# Patient Record
Sex: Male | Born: 1955 | Race: Black or African American | Hispanic: No | Smoking: Never smoker
Health system: Southern US, Community
[De-identification: ages and names within clinical notes are randomized; demographics above are authoritative.]

## PROBLEM LIST (undated history)

## (undated) DIAGNOSIS — I10 Essential (primary) hypertension: Secondary | ICD-10-CM

## (undated) HISTORY — PX: ACHILLES TENDON REPAIR: SUR1153

## (undated) HISTORY — PX: HIP SURGERY: SHX245

## (undated) HISTORY — PX: KNEE SURGERY: SHX244

---

## 2002-11-09 ENCOUNTER — Ambulatory Visit (HOSPITAL_COMMUNITY): Admission: RE | Admit: 2002-11-09 | Discharge: 2002-11-09 | Payer: Self-pay | Admitting: Orthopaedic Surgery

## 2002-11-09 ENCOUNTER — Ambulatory Visit (HOSPITAL_BASED_OUTPATIENT_CLINIC_OR_DEPARTMENT_OTHER): Admission: RE | Admit: 2002-11-09 | Discharge: 2002-11-09 | Payer: Self-pay | Admitting: Orthopaedic Surgery

## 2003-01-18 ENCOUNTER — Inpatient Hospital Stay (HOSPITAL_COMMUNITY): Admission: RE | Admit: 2003-01-18 | Discharge: 2003-01-21 | Payer: Self-pay | Admitting: Orthopaedic Surgery

## 2004-08-28 ENCOUNTER — Inpatient Hospital Stay (HOSPITAL_COMMUNITY): Admission: RE | Admit: 2004-08-28 | Discharge: 2004-08-31 | Payer: Self-pay | Admitting: Orthopaedic Surgery

## 2005-02-21 ENCOUNTER — Inpatient Hospital Stay (HOSPITAL_COMMUNITY): Admission: RE | Admit: 2005-02-21 | Discharge: 2005-02-25 | Payer: Self-pay | Admitting: Orthopaedic Surgery

## 2006-01-09 ENCOUNTER — Inpatient Hospital Stay (HOSPITAL_COMMUNITY): Admission: RE | Admit: 2006-01-09 | Discharge: 2006-01-14 | Payer: Self-pay | Admitting: Orthopaedic Surgery

## 2006-12-11 ENCOUNTER — Inpatient Hospital Stay (HOSPITAL_COMMUNITY): Admission: RE | Admit: 2006-12-11 | Discharge: 2006-12-15 | Payer: Self-pay | Admitting: Orthopaedic Surgery

## 2008-05-14 IMAGING — CR DG CHEST 1V PORT
1 series · 1 of 1 positions shown · non-contrast
Comparison: none

CLINICAL DATA: Fever.  Degenerative joint disease, right hip. 
PORTABLE CHEST ? 1 VIEW:

[view not recorded]
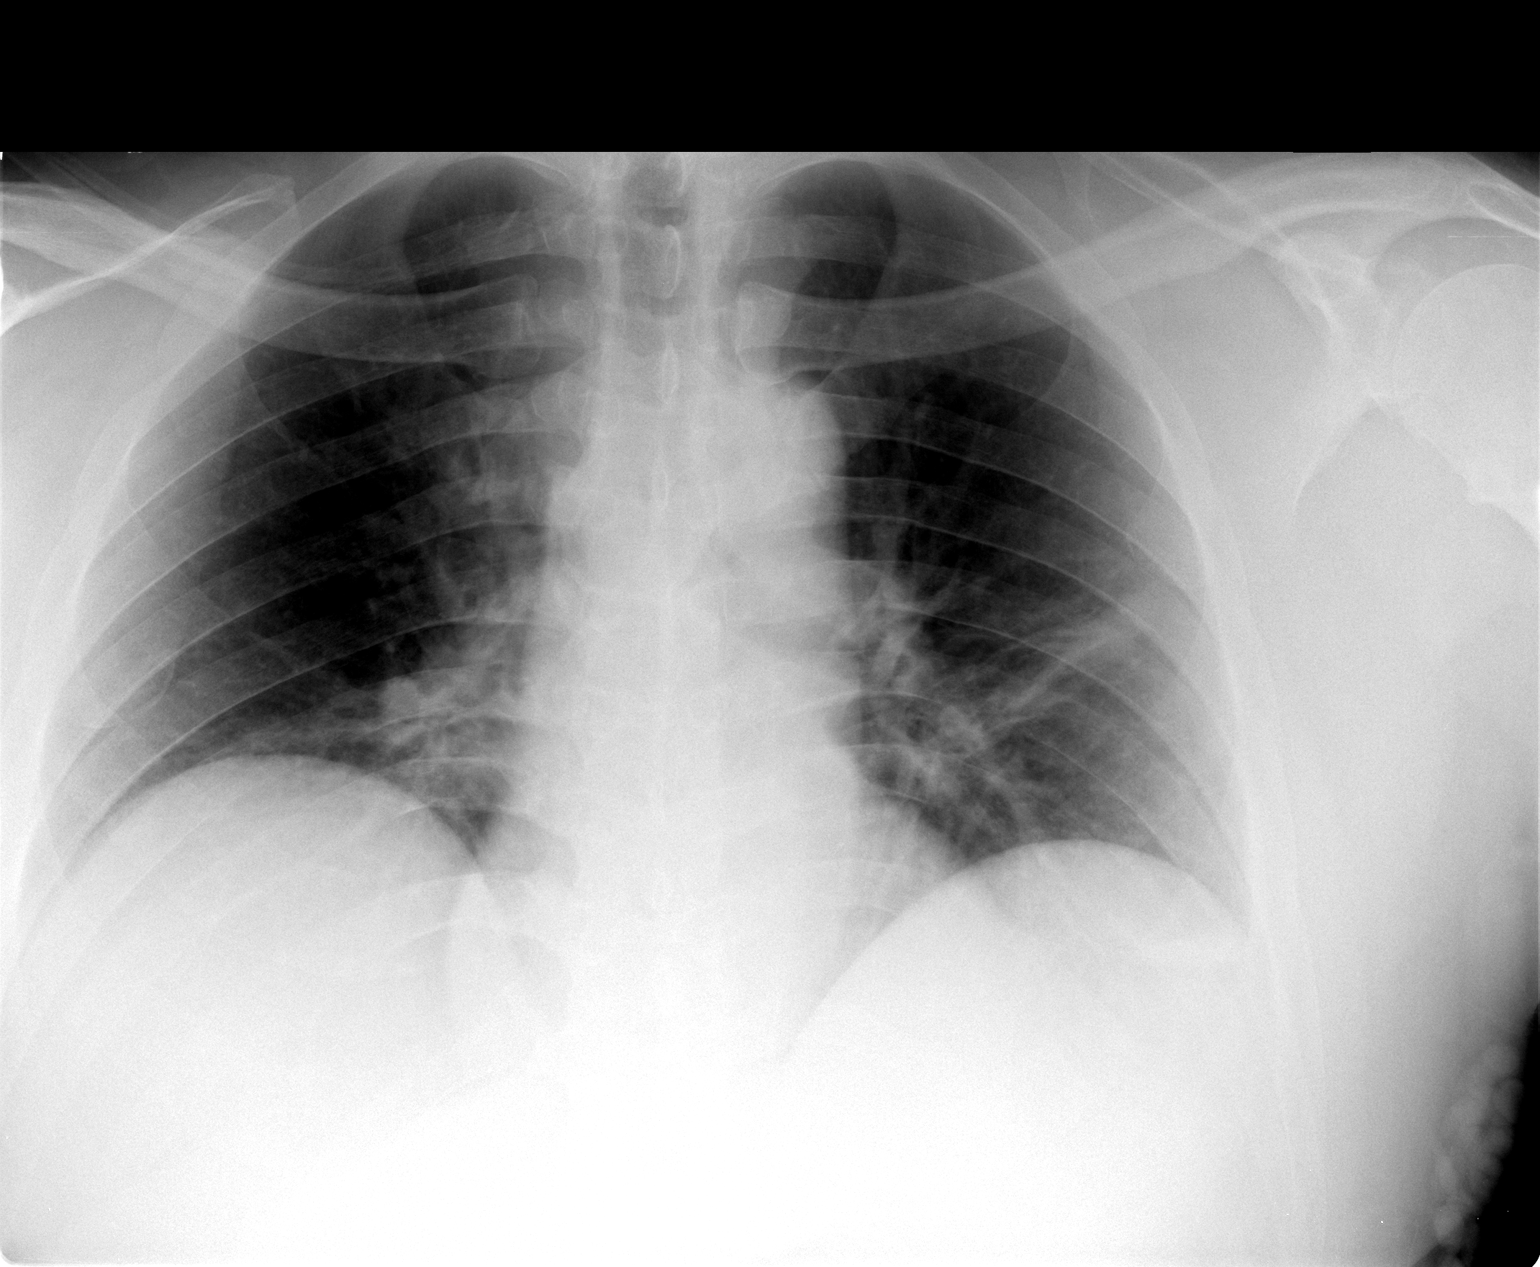

[1 of 1 positions shown; findings below may reference images not displayed]

FINDINGS: Subsegmental atelectasis in the mid and lower lungs noted on this low-volume film.  The cardiomediastinal silhouette is unremarkable.  Mild peribronchial thickening is stable.
IMPRESSION: Increased subsegmental atelectasis within the mid and lower lung.

## 2010-06-26 NOTE — Op Note (Signed)
[name], [name]            ACCOUNT NO.:  [id number]   MEDICAL RECORD NO.:  [id number]          PATIENT TYPE:  INP   LOCATION:  2550                         FACILITY:  [facility]   PHYSICIAN:  Lubertha Basque. Dalldorf, M.D.DATE OF BIRTH:  1955/08/16   DATE OF PROCEDURE:  12/11/2006  DATE OF DISCHARGE:                               OPERATIVE REPORT   PREOPERATIVE DIAGNOSIS:  Left hip degenerative arthritis.   POSTOPERATIVE DIAGNOSIS:  Left hip degenerative arthritis.   PROCEDURE:  Left total hip replacement.   ANESTHESIA:  General.   ATTENDING SURGEON:  Lubertha Basque. Jerl Santos, M.D.   ASSISTANT:  Lindwood Qua, P.A.   INDICATIONS FOR PROCEDURE:  The patient is a 55 year old male with a  long history of severe arthritis of the knees and hips.  He is status  post replacement of his other hip and both knees and has disabling pain  about the left hip.  He has bone on bone degeneration by x-ray and pain  which limits his ability to walk and rest.  He is offered a hip  replacement procedure, having failed conservative measures of oral  antiinflammatories and rest.  Informed operative consent was obtained  after discussion of possible complications of reaction to anesthesia,  infection, DVT, PE, dislocation and death.   SUMMARY OF FINDINGS AND PROCEDURE:  Under general anesthesia through a  posterior approach, a left total hip replacement was performed.  He had  advanced degenerative change with flattening of the femoral head and  large osteophytes, some of which were removed.  He had excellent bone  quality.  We addressed this problem with an uncemented DePuy ASR system  using a size 8 high offset Summit stem with a -1 size 51 ASR cuff.  This  fit into a 58 ASR shell.  Bryna Colander assisted me throughout and was  invaluable to the completion of the case in that he helped position and  retract while I performed the procedure.  He also closed simultaneously  to help minimize OR time.   DESCRIPTION OF PROCEDURE:  The patient was taken to the operative suite  where general anesthetic was applied without difficulty.  He was  positioned in the lateral decubitus position with the left hip up.  Bony  prominences were all added.  Hip positioners were utilized and an  axillary roll was placed.  He was prepped and draped in the normal  sterile fashion.  After the administration of IV Kefzol, a posterior  approach was taken to the left hip.  A skin incision was made followed  by exchange of knives with further dissection down to the IT band.  All  appropriate antiinfective measures used including closed hooded exhaust  systems for each member of the surgical team, preoperative IV antibiotic  and Betadine impregnated drape.  The IT band and gluteus maximus fascia  were incised longitudinally to expose the short external rotators of the  hip which were tagged and reflected.  A posterior capsulectomy was  performed and the hip was dislocated.  A femoral neck cut was made just  above the lesser trochanter.  The acetabulum was  fully exposed and we  removed some labral tissue sharply.  I then reamed towards the medial  wall and then sequentially up to 57, following the placement of a size  58 ASR cup in appropriate anteversion and tilt.  We then turned our  attention to the femur.  The canal was sounded followed by reaming up to  about an 8.  Sequential broaching up to an 8 which seemed to fit very  well.  We did a trial reduction with a -1.  Assembly seemed to fit the  best with the high offset component.  He was stable in extension with  external rotation and flexion with internal rotation.  His leg lengths  were felt to be about equal.  The trial components were removed followed  by placement of a size high off the 8 Summit stem in slight anteversion.  This was then capped with the -154 ASR head assembly.  The hip was again  reduced and was stable once again.  The wound was irrigated,  followed by  reapproximation of the short external rotators to the greater  trochanteric region with nonabsorbable suture.  IT band and gluteus  maximus fascia were reapproximated with #1 Vicryl in interrupted  fashion.  Subcutaneous tissues were reapproximated with 0 and 2-0 undyed  Vicryl followed by clean closure with staples.  Adaptic was applied  followed by dry gauze and tape.  Estimated blood loss as well as fluids  can be obtained from the anesthesia records.   DISPOSITION:  The patient was extubated in the operating room and taken  to recovery room stable.  He was to be admitted to the orthopedic  surgery service for appropriate postoperative care to include  perioperative antibiotics and Coumadin plus Lovenox for DVT prophylaxis.      Lubertha Basque Jerl Santos, M.D.  Electronically Signed     PGD/MEDQ  D:  12/11/2006  T:  12/11/2006  Job:  [id number]

## 2010-06-26 NOTE — Discharge Summary (Signed)
[name], SAINVIL            ACCOUNT NO.:  [id number]   MEDICAL RECORD NO.:  [id number]          PATIENT TYPE:  INP   LOCATION:  5030                         FACILITY:  [facility]   PHYSICIAN:  Lubertha Basque. Dalldorf, M.D.DATE OF BIRTH:  October 17, 1955   DATE OF ADMISSION:  12/11/2006  DATE OF DISCHARGE:  12/15/2006                               DISCHARGE SUMMARY   ADMITTING DIAGNOSES:  1. 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  2. Hypertension.  3. Status post right hip total hip replacement.  4. Bilateral knee replacements.   DISCHARGE DIAGNOSES:  1. 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  2. Hypertension.  3. Status post right hip total hip replacement.  4. Bilateral knee replacements.   OPERATION:  Left total hip replacement   BRIEF HISTORY:  The patient is a 55 year old black male, a patient well  known to our practice.  We have replaced his other hip and both knees in  the past, and he has done well.  He does have significant  osteoarthritis, bone-on-bone DJD as seen on x-ray in his left hip.  I  have discussed with him treatment options that being total hip  replacement.   PERTINENT LABORATORY AND X-RAY FINDINGS:  Most recent INR is 1.9.  Chest  x-ray, no active disease.  Hemoglobin 9.4, hematocrit 27.3, WBC 14.7, platelets 162.  Sodium 132,  potassium 3.5, BUN 33, creatinine 2.13, glucose 130.   HOSPITAL COURSE:  He was admitted postoperatively and placed of p.o. and  IM analgesics for pain.  A PCA Dilaudid pump was used.  IV Ancef 1 gram  q.8h. x3 doses and then DVT prophylaxis given, i.e., Lovenox and  Coumadin protocol per pharmacy.  He was also given knee-high TEDs,  incentive spirometry and then to be active with physical therapy, touch  down weight bearing as tolerated.  The subsequent days postoperatively,  first day postop his temperature was 100.3.  He did spike to 103 which  in our experience had happened to him on other numerous occasions with  other joint replacements without any other symptoms.  No lung symptoms.  Bowel and bladder habits were normal, and the wound was noted to be  benign on all dressing changes.  Lungs were clear.  Abdomen was soft.  The Foley catheter was discontinued the first day postop.  His labs were  checked daily as described above.   On the second day postop his dressing was changed.  The wound was  benign.  The thigh was soft, calf soft with negative Homans.  He was  having some difficulty getting around with therapy, so that has required  probably an extra day in the hospital, but he was discharged.   CONDITION ON DISCHARGE:  Improved.   FOLLOW UP:  1. He will remain on Ambien 10 mg, take 1 at night as needed for      sleep.  2. Percocet 1 or 2 q.4 to 6h. p.r.n. pain.  3. Coumadin dose to be regulated by pharmacy for a total of 4 weeks.  4. He will remain on his other home medications which will be  Lisinopril and Lipitor.   DIET:  Low sodium, heart healthy.   ACTIVITY:  Touch-down weight bearing.  May shower and get this area wet  and pat it dry.  May change his dressing daily.   FOLLOW UP:  Return to Dr. Nolon Nations office in 10 days;  [phone] if  any sign of infection to call that number as well.      Lindwood Qua, P.A.      Lubertha Basque Jerl Santos, M.D.  Electronically Signed    MC/MEDQ  D:  12/15/2006  T:  12/15/2006  Job:  [id number]

## 2010-06-29 NOTE — Op Note (Signed)
NAMEMACKAY, HANAUER            ACCOUNT NO.:  [id number]   MEDICAL RECORD NO.:  [id number]          PATIENT TYPE:  INP   LOCATION:  2550                         FACILITY:  [facility]   PHYSICIAN:  Lubertha Basque. Dalldorf, M.D.DATE OF BIRTH:  1955/09/09   DATE OF PROCEDURE:  02/21/2005  DATE OF DISCHARGE:                                 OPERATIVE REPORT   PREOP DIAGNOSIS:  Right knee degenerative arthritis.   POSTOPERATIVE DIAGNOSIS:  Right knee degenerative arthritis.   PROCEDURE:  Right total knee replacement.   ANESTHESIA:  General and block.   ATTENDING SURGEON:  Dalldorf.   ASSISTANT:  Carnaghi, PA   INDICATIONS FOR PROCEDURE:  The patient is a 55 year old man with a long  history of bilateral knee pain. He has end stage degeneration. On the  opposite side he is status post a successful knee replacement, but  unfortunately, he required revision of his tibial tray to a revision stem as  he loosened prematurely. He has end-stage degeneration on the right which  has not responded to multiple injections as well as an arthroscopy more than  a year ago. He has pain which limits his ability to rest and work and he is  offered a knee replacement. We planned to place a revision tray primarily on  this side. Informed operative consent was obtained after discussion of  possible complications of reaction to anesthesia, infection, DVT, PE, and  death.   DESCRIPTION OF PROCEDURE:  The patient was taken to the operating suite  where general anesthetic was applied without difficulty. He was positioned  supine. He was also given a block in the preanesthesia area. He was prepped  and draped in normal sterile fashion. After administration of preop IV  Kefzol, the right leg was elevated, exsanguinated, tourniquet inflated about  the thigh. A longitudinal incision was made with dissection down to the  extensor mechanism. All appropriate anti-infective measures were used  including the preoperative  IV antibiotic, Betadine impregnated drape, and  closed hooded exhaust systems for each member of the surgical team. A medial  parapatellar incision was made. The kneecap was flipped and the knee flexed.  He had severe degenerative change all three compartments with large  osteophytes about the joint. He had excellent bone quality. Some residual  meniscal tissues were removed along with his ACL and PCL. An intramedullary  guide was placed in the tibia in order to create a flat cut for the revision  tray. The tibia sized to a 5, which was the largest component of the tibia  revision system. We reamed appropriately. The femur sized to a large plus  like the opposite side and the appropriate guide was placed and utilized. We  initially made our flexion gap about 15 mm and subsequently and made a  flexion and extension gap of 15 mm. He did hyperextend slightly and it  looked as though the 17.5 spacer fit the best and restored good stability  with just mild hyperextension. These were the same components used on the  opposite knee. The trial components were removed. The patella was cut down  in thickness from  26 to 16 and sized to a 41 with appropriate guide placed  and utilized. Trial components were then placed and he came to slight  hyperextension and flexed well. The kneecap tracked in normal position. The  trial components removed followed by pulsatile lavage irrigation of all the  cut bony surfaces. Cement was mixed including antibiotic and was placed on  all three bones including into the tibial metaphyseal region. We then placed  DePuy LCS components which were large plus femur, 17.5 mm deep dish spacer,  size 5 revision MBT tray with a 30 x 12 stem, and a 41 mm all polyethylene  patella. Excess cement was trimmed and pressure was held on the components  until the cement hardened. It did take a second batch to do the patella as  we were not happy with the way it seated initially and decided  to delay  placement of patella for a second batch of cement. The tourniquet was  deflated once the cement hardened. The tourniquet time was approximately an  hour and a half. The knee was thoroughly irrigated followed by control of  bleeding with Bovie cautery. The knee was again irrigated followed by  placement of a drain exiting superolaterally. The extensor mechanism was  then reapproximated with #1 Vicryl in interrupted fashion. Subcutaneous  tissues reapproximated with O and 2-0 undyed Vicryl followed by skin closure  with staples. Adaptic was placed over the wound followed by dry gauze and  loose Ace wrap. Estimated blood loss and intraoperative fluids can be  obtained from anesthesia records.   DISPOSITION:  The patient was extubated in the operating room and taken to  recovery in stable addition. Plans were for him to be admitted to the  orthopedic surgery service for appropriate postop care to include  perioperative antibiotics and Coumadin plus Lovenox for DVT prophylaxis.      Lubertha Basque Jerl Santos, M.D.  Electronically Signed     PGD/MEDQ  D:  02/21/2005  T:  02/21/2005  Job:  [id number]

## 2010-06-29 NOTE — Op Note (Signed)
[name], [name]            ACCOUNT NO.:  [id number]   MEDICAL RECORD NO.:  [id number]          PATIENT TYPE:  INP   LOCATION:  2899                         FACILITY:  [facility]   PHYSICIAN:  Lubertha Basque. Dalldorf, M.D.DATE OF BIRTH:  10-23-1955   DATE OF PROCEDURE:  08/28/2004  DATE OF DISCHARGE:                                 OPERATIVE REPORT   PREOPERATIVE DIAGNOSIS:  Loose left total knee replacement.   POSTOPERATIVE DIAGNOSIS:  Loose left total knee replacement.   PROCEDURE:  Revision of tibial component, left total knee replacement.   ANESTHESIA:  General.   ATTENDING SURGEON:  Elsie Lincoln, M.D.   ASSISTANT:  Dr. Renae Fickle and [place], [place]   INDICATIONS FOR PROCEDURE:  The patient is a 55 year old man with a work-  related injury to his knees. He is a couple of years out from a knee  replacement on the left which did very well but once released to his job, he  was required to do some things which were outside the parameters of our  understanding and put too much stress on this knee leading to loosening.  This was confirmed by bone scan. He underwent an aspiration and culture  prior to the procedure and this came back negative. He has pain with  activities and rest pain, as well, and is offered a revision operation.  Informed operative consent was obtained after discussion of the possible  complications of reaction to anesthesia, infection, DVT, PE, and death.   DESCRIPTION OF PROCEDURE:  The patient was taken to the operating suite  where general anesthetic was applied without difficulty. He was positioned  supine and prepped and draped in normal sterile fashion. After  administration with IV antibiotics, the left leg was elevated,  exsanguinated, and a tourniquet inflated about the thigh. His old incision  was used with dissection down through an average amount of adipose tissue to  the extensor mechanism. All appropriate anti-infective measures were used  including  closed hooded exhaust systems for each member of the surgical  team, Betadine-impregnated drape, and preoperative IV antibiotic. A medial  parapatellar incision was made in the extensor mechanism. The kneecap was  slipped in a lateral direction and the knee was gradually flexed up. I  tapped on the femoral component and this seemed to be very secure. I then  tapped on the tibial component and there seemed to be some mild motion. I  slipped a osteotome under the tibial component and it came free quite  easily. I removed most of cement with osteotomes and rongeurs. We then  reamed down the canal and placed an intramedullary guide by which to make a  revision tibial cut just below the cement mantle. The tibia sized to a five  component and appropriate guide was placed and utilized in order to place a  stemmed component. A trial reduction was done with this and various spacers  and the 17.5 seemed to give him the best stability though he did continue  with slight hyperextension. Trial components were removed followed by  pulsatile lavage irrigation. Cement was mixed including antibiotic and was  pressurized  onto the proximal tibia. The DePuy size #5MBT revision component  with a stem was then cemented into place. Excess cement was trimmed and  pressure was held on the component until the cement had hardened. I then  placed a 17.5 deep dish spacer. The knee came to full extension with slight  hyperextension and easily flexed with this set of components. The patella  was not revised and appeared benign. The knee was irrigated followed by  release of the tourniquet. A moderate amount of bleeding was easily  controlled with Bovie cautery. A drain was placed exiting superolaterally  followed by reapproximation of the extensor mechanism with #1 Vicryl  interrupted fashion. Subcutaneous tissues were reapproximated with O and 2-0  undyed Vicryl followed by skin closure with staples. The knee flexed to  120  degrees against gravity at the end of the case. Adaptic was applied to the  wound followed by dry gauze and a loose Ace wrap. Estimated blood loss,  intraoperative fluids , and accurate. tourniquet time (which was  approximately 1 hour) can be obtained from anesthesia records.   DISPOSITION:  The patient was extubated in the operating room and taken to  recovery in stable addition. Plans were for him to be admitted to the  orthopedic surgery service for appropriate postop care to include  perioperative antibiotics and Coumadin plus Lovenox for DVT prophylaxis.       PGD/MEDQ  D:  08/28/2004  T:  08/28/2004  Job:  [id number]

## 2010-06-29 NOTE — Op Note (Signed)
[name], [name]            ACCOUNT NO.:  [id number]   MEDICAL RECORD NO.:  [id number]          PATIENT TYPE:  INP   LOCATION:  2899                         FACILITY:  [facility]   PHYSICIAN:  Lubertha Basque. Dalldorf, M.D.DATE OF BIRTH:  1955-07-05   DATE OF PROCEDURE:  01/09/2006  DATE OF DISCHARGE:                               OPERATIVE REPORT   PREOPERATIVE DIAGNOSIS:  Right hip degenerative arthritis.   POSTOPERATIVE DIAGNOSIS:  Right hip degenerative arthritis.   PROCEDURE:  Right total hip replacement.   ANESTHESIA:  General.   ATTENDING SURGEON:  Lubertha Basque. Jerl Santos, M.D.   ASSISTANT:  Lindwood Qua, PA   INDICATIONS FOR PROCEDURE:  The patient is a 55 year old man with a long  history of painful knees and hips.  He is status post successful knee  replacement on both sides and at this point has disabling pain due to  hip arthritis.  He has advanced degenerative change and large osteophyte  formation with collapse of his femoral head on both sides.  He has  failed all known oral anti-inflammatories.  He persists with pain  despite using a cane.  He has pain which limits his ability to work and  rest and walk and he is offered a hip replacement on the right.  Informed operative consent was obtained after discussion of possible  complications of reaction to anesthesia, infection, DVT, PE,  dislocation, and death.   SUMMARY OF FINDINGS AND PROCEDURE:  Under general anesthesia through a  posterior approach a right total hip replacement was performed.  He had  extensive osteophyte formation and some extensive dissection was  required to remove excessive osteophytes from the acetabular rim and the  femoral neck region.  We were able to address this problem with an ASR  DePuy system using a size 60 ASR cup with a size 10 Summit high offset  stem.  We used the +5 spacer and 53 mm cobalt chrome head.  Bryna Colander assisted throughout and was invaluable to the completion of  the  case in that he helped position and retract so I could perform the  procedure.  He also closed simultaneously to help minimize OR time.   DESCRIPTION OF PROCEDURE:  The patient was taken to an operating suite  where general anesthetic was applied without difficulty.  He was  positioned in the lateral decubitus position with the right hip up.  An  axillary roll was placed and all bony prominences were appropriately  padded.  We did use hip positioners.  He was prepped and draped in  normal sterile fashion.  After the administration of preop IV antibiotic  posterior approach was taken to the right hip.  All appropriate anti-  infective measures were used including closed hooded exhaust systems for  each member of surgical team, Betadine impregnated drape, and the  preoperative IV Kefzol.  Dissection was carried down through a moderate  amount of adipose tissue to expose the IT band and gluteus maximus  fascia.  These structures were incised longitudinally to expose the  short external rotators of the hip which were tagged and reflected.  The  hip was dislocated.  He had abundance of osteophytes all about the  femoral neck all the way down to the lesser trochanteric region.  A neck  cut was made just above the lesser trochanter.  We removed some  osteophytes to fashion a fairly normal proximal femoral region.  Attention was turned toward the acetabulum.  Some labral structures were  removed.  Some very large osteophytes were also removed with a rongeur  and osteotomes.  We reamed with a small reamer towards the medial wall  of the acetabulum and sequentially reamed up to 59.  We then placed a  size 60 ASR cup in appropriate anteversion and tilt.  Attention was then  turned toward the femur.  This was exposed appropriately.  We used the  canal finder followed by reaming up to nine and eventually 10.  We  broached and eventually settled on the largest femoral insert which was  a 10.  We  did a trial reduction.  This was stable in extension with  external rotation and flexion with internal rotation.  There was really  minimal impingement having removed all the excess osteophytes.  The  trial component was removed followed by placement of a size 10 high  offset Summit stem in appropriate anteversion.  This seated fully.  We  then placed a size +5 spacer with a 53 mm cobalt chrome head.  Hip  reduced well and again was stable in the aforementioned positions.  The  wound was irrigated.  No drain was felt to be necessary.  The short  external rotators were reapproximated to the greater trochanteric region  with nonabsorbable suture.  IT band and gluteus maximus fascia  reapproximated #1 Vicryl in interrupted fashion followed by subcutaneous  reapproximation with 0 and 2-0 undyed Vicryl and skin closure with  staples.  Adaptic was applied to the wound followed by dry gauze and  tape.  Estimated blood loss and intraoperative fluids can be obtained  from anesthesia records.   DISPOSITION:  The patient was extubated in the operating room and taken  to recovery room in stable addition.  Plans were for him to be admitted  to the orthopedic surgery service for appropriate postop care to include  perioperative antibiotics and Coumadin plus Lovenox for DVT prophylaxis.      Lubertha Basque Jerl Santos, M.D.  Electronically Signed     PGD/MEDQ  D:  01/09/2006  T:  01/09/2006  Job:  [id number]

## 2010-06-29 NOTE — Op Note (Signed)
[name], [name]                        ACCOUNT NO.:  [id number]   MEDICAL RECORD NO.:  [id number]                   PATIENT TYPE:  INP   LOCATION:  5034                                 FACILITY:  [facility]   PHYSICIAN:  Lubertha Basque. Jerl Santos, M.D.             DATE OF BIRTH:  [birth year]   DATE OF PROCEDURE:  01/18/2003  DATE OF DISCHARGE:                                 OPERATIVE REPORT   PREOPERATIVE DIAGNOSIS:  Left knee degenerative arthritis.   POSTOPERATIVE DIAGNOSIS:  Left knee degenerative arthritis.   PROCEDURE:  Left total knee replacement.   ANESTHESIA:  General.   SURGEON:  Lubertha Basque. Jerl Santos, M.D.   ASSISTANTS:  Bradley Ferris, M.D. and Lindwood Qua, P.A.   INDICATIONS FOR PROCEDURE:  The patient is a 55 year old man about a year  out from a work related injury to both of his knees. He is status post  a  successful knee arthroscopy and has end-stage degenerative changes on the  left exacerbated by his accident. He is offered a knee replacement with  disabling pain at rest and with activity on this side. Informed operative  consent was obtained after a discussion of the possible complications of  reaction to anesthesia, infection, deep venous thrombosis, PE and death.   DESCRIPTION OF PROCEDURE:  The patient was brought to the operating suite  where general anesthesia was applied without difficulty. He was positioned  supine and prepped and draped in the normal sterile fashion. After  administration of preoperative IV antibiotics, the left leg was elevated,  exsanguinated and the tourniquet inflated about the thigh.   An anterior longitudinal incision was made  dissection down to the extensor  mechanism. All appropriate anti-infective measures were used including  preoperative IV antibiotics, Betadine impregnated drape and closed, hooded  exhaust systems for each member of the surgical team. The knee ranged from  about 10  to 90 degrees with the patient  asleep.   A medial peripatellar incision was made. The kneecap was flipped. The knee  flexed  initially to about 90 degrees. With both extensive soft tissue  dissection and bone cuts, we eventually flexed him up to about 130 for the  case. He had some very large loose bodies in the knee which we removed. He  had severe degenerative change  in all 3 compartments with deformation of  the bones. Some large osteophytes were sawed off the tibia and patella.   An extramedullary guide was placed on the tibia, and was utilized to make a  tibial cut with a slight posterior tilt. The femur was sized to a large plus  component and the appropriate guide was placed and utilized in an  intramedullary fashion. This was created anteriorly  and posteriorly making  a flexion gap of 10 mm. A 2nd intramedullary guide was placed in the femur  to make a distal cut and an equal extension gap of 10 mm.  Some soft tissue releases were performed medially to address his mild varus  deformity.   A chamfer cutting guide was placed on the femur and utilized to make the  chamfer cuts with a large plus component. The tibia was exposed. This sized  to the largest size 6 component. The appropriate guide was placed through a  needle eye to create a keel. Trial reduction was done with these components,  and a 10-mm spacer. He easily came to slight hyperextension and flexed past  the 100 without any trouble. The knee cap did track well.   The patella was cut down in thickness to 16 mm and sized to fit the size 42  component. Appropriate guides were utilized to create the 3 drill holes to  accept the all polyethylene component from the J and J set. Trial components  were removed. Some large loose bodies were removed from the posterior aspect  of the knee. All cut bony surfaces were addressed with pulse lavage.   Cement was mixed including  the antibiotic and was pressurized to the cut  bony surfaces. The aforementioned  LCS DePuy components were placed which  were large plus femur, 6 tibia and 41 all polyethylene patella with a 10-mm  deep dish spacer. Excess cement was trimmed and pressure was held on the  components until the cement had hardened.   The knee was again irrigated followed  by release of the tourniquet. A small  amount of bleeding was easily controlled with Bovie cautery. I did place a  drain exiting superolaterally with the Autovac system. The extensor  mechanism was reapproximated with #1 Vicryl in an interrupted fashion. Once  this was accomplished the knee was easily flexed to 110 degrees.   The subcutaneous tissue was reapproximated with 0 and 2-0 undyed Vicryl  followed  by skin  closure with staples. Adaptic was placed over the wound  followed by dry gauze and a loose Ace wrap. Estimated blood loss was and  intraoperative fluids can be obtained from the anesthesia records as can an  accurate tourniquet time which was just over an hour.   DISPOSITION:  The patient was extubated in the operating room and taken to  the recovery room stable. Plans were for him to be admitted to the  orthopedic surgery service for appropriate postoperative care to include  perioperative antibiotics and Coumadin plus Lovenox for DVT prophylaxis.                                               Lubertha Basque Jerl Santos, M.D.    PGD/MEDQ  D:  01/18/2003  T:  01/18/2003  Job:  [id number]

## 2010-06-29 NOTE — Discharge Summary (Signed)
[name], [name]            ACCOUNT NO.:  [id number]   MEDICAL RECORD NO.:  [id number]          PATIENT TYPE:  INP   LOCATION:  5009                         FACILITY:  [facility]   PHYSICIAN:  Lubertha Basque. Dalldorf, M.D.DATE OF BIRTH:  [birth year]   DATE OF ADMISSION:  08/28/2004  DATE OF DISCHARGE:  08/31/2004                                 DISCHARGE SUMMARY   ADMISSION DIAGNOSES:  1.  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  2.  Hypertension.   DISCHARGE DIAGNOSES:  1.  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  2.  Hypertension.   OPERATION:  Left total knee revision tibial component.   BRIEF HISTORY:  [name] is a patient well-known to our practice, 55 year old  black male who, some time ago had a total knee replacement on the left side  and had done well until returning to work.  His work put a tremendous amount  of stress on his left knee and we believed caused his tibial component  loosening.  Bone scan confirmed that his tibial component was loose and we  discussed with him treatment options, that being tibial component revision  with the risk of anesthesia, DVT, infection, and possible death.   PERTINENT LABORATORY AND X-RAY FINDINGS:  Cultures done at the time of  surgery were normal.  Sodium 133, potassium 3.4, glucose 121, creatinine  1.3, calcium 8.5, BUN 9, INR 1.2.  WBC 11.0, hemoglobin 12.6, hematocrit  36.7.   No active lung disease on chest x-ray.   HOSPITAL COURSE:  Patient was admitted postoperatively.  He was kept on his  Lisinopril, hydrochlorothiazide 20/25 one a day which was his blood pressure  medicine.  He was also given a morphine PCA pump, lactated Ringer's IV,  Ancef 1 g q.8h. x3 doses, Trinsicon caps, Coumadin and Lovenox protocol per  pharmacy, Percocet by mouth, incentive spirometry, knee-high TEDs, CPM  machine advanced as tolerated and follow-up lab work as described above.  He  could be  weightbearing as tolerated.   His first day postoperatively, his vital signs were stable.  His PCA pump  was working well.  He was voiding well.  Sign of infection negative.  No  redness, no drainage.  Hemoglobin and hematocrit minimal to no drop, 37.3  hematocrit.  Potassium 3.5, INR 1.2.  Good neurovascular status to his toes.   His second day postoperatively, his blood pressure was 123/76, heart rate  96, temperature 98.  Hemoglobin 12.6, wbc 11.0, potassium 3.4, INR 1.2, no  sign of infection in his knee.  His lungs are clear.  His abdomen weakness  soft.   The third day postoperatively, he requested going home.  His blood pressure  was 123/61, heart rate 94, temperature 98, lungs were clear.  Abdomen was  soft.  Wound on his left knee was benign.  No sign of infection.  Motion to  at least 75 to 80 degrees with good neurovascular status.  His dressing and  drains were pulled the second day postoperatively without difficulty.   CONDITION ON DISCHARGE:  Improved.   FOLLOW UP:  He will have no diet restrictions.  No driving for two to three  weeks.  May change his dressing daily.  Using crutches or walker and be  weightbearing as tolerated.   He will be on Lisinopril/hydrochlorothiazide 20/25.  Percocet one or two  every four to six hours for pain.  Coumadin and Lovenox dose per pharmacy.  We would like to have him on the Lovenox until his INR is greater than 2.0.   He will return to see Dr. Jerl Santos in seven to 10 days.  Call [phone] for  that appointment, any sign of infection, also call that same number with any  increasing redness, pain, drainage or heat.  Home therapy and protimes to be  done by [facility].  Continue with ice on his knee.       MC/MEDQ  D:  08/31/2004  T:  08/31/2004  Job:  [id number]

## 2010-06-29 NOTE — Discharge Summary (Signed)
[name], [name]            ACCOUNT NO.:  [id number]   MEDICAL RECORD NO.:  [id number]          PATIENT TYPE:  INP   LOCATION:  5021                         FACILITY:  [facility]   PHYSICIAN:  Lubertha Basque. Dalldorf, M.D.DATE OF BIRTH:  09/13/1955   DATE OF ADMISSION:  02/21/2005  DATE OF DISCHARGE:  02/25/2005                                 DISCHARGE SUMMARY   ADMITTING DIAGNOSES:  1.  End-stage degenerative joint disease, right knee.  2.  Hypertension.   DISCHARGE DIAGNOSES:  1.  End-stage degenerative joint disease, right knee.  2.  Hypertension.   OPERATIONS:  Right total knee replacement.   BRIEF HISTORY:  [name] is a 55 year old black male patient well known  to our practice.  He was originally hurt on the job and is covered under  [employer].  He is having increasing right knee pain when he  sleeps, pain when he ambulates and swelling.  He has failed corticosteroid  injections, Synvisc injections and oral anti-inflammatory medications.  We  have discussed treatment options with him and that being a total knee  replacement with the risk of anesthesia, infection, DVT and possible death.  He has had the other knee replaced for the same type of conditions.   PERTINENT LABORATORY AND X-RAY FINDINGS:  Chest x-ray:  No active lung  disease.  Normal sinus rhythm on his EKG.  WBCs 13.1, RBCs 3.46, hemoglobin  11.7 with a drop from 16.8 and platelets at 150.  Serial INRs were done, as  he was on low-dose Coumadin protocol.  Sodium 133, potassium 3.1, glucose in  the range 111-176, BUN of 14, creatinine of 1.4 and calcium at 8.5.   COURSE IN THE HOSPITAL:  He was admitted postoperatively.  His medical  reconciliation sheet was filled out.  He was kept on  lisinopril/hydrochlorothiazide 1 daily.  He was also on a PCA morphine  protocol full dose, IV lactated ringers, IV Ancef 1g q.8h. x3 given,  Coumadin and Lovenox protocol per pharmacy for DVT prophylaxis,  laxative  enema of choice p.r.n., Reglan for nausea, incentive spirometry, knee-high  TEDs and then increased activity with physical therapy to be weight bearing  as tolerated.  Autovac protocol.  Followup lab studies as mentioned above.  He can be, once again, weight bearing as tolerated.  The CPM machine was 0-  50 and then advanced 10 degrees daily.   In addition, the first day postop, pharmacy was helpful with Coumadin  regulation, vital signs were stable, his PCA was working, he was voiding,  wound was benign and no sign of infection or irritation.  The second day  postop, his dressing was changed, Hemovac was removed, no signs of  infection, good pulses distally, vital signs were stable and lungs were  clear.  The drain was discontinued the third day postop, dressing changed on  that day, incision was normal and held together with staples.  On January  15, his blood pressure was 111/69, temperature was 99, INR was 1.9, he had  good breath sounds, abdomen was soft, knee showed no sign of infection, he  will be  Coumadin for 30 days and was discharged home.   CONDITION ON DISCHARGE:  Improved.   He will be kept on his home medicine, lisinopril/hydrochlorothiazide 1 a  day.  He is given a prescription for Percocet 1 or 2 every 4-6 hours.  Coumadin dose per pharmacy.  Diet unrestricted.  He could use crutches or a  walker.  He may change his dressing daily.  [facility] for physical  therapy and pro times, to follow up with Dr. Jerl Santos in 10 days, and  problems before that to call particularly if there is any sign of infection.      Lindwood Qua, P.A.      Lubertha Basque Jerl Santos, M.D.  Electronically Signed    MC/MEDQ  D:  04/02/2005  T:  04/02/2005  Job:  [id number]

## 2010-06-29 NOTE — Discharge Summary (Signed)
NAMEJOURNEE, KOHEN            ACCOUNT NO.:  [id number]   MEDICAL RECORD NO.:  [id number]          PATIENT TYPE:  INP   LOCATION:  5010                         FACILITY:  [facility]   PHYSICIAN:  Lubertha Basque. Dalldorf, M.D.DATE OF BIRTH:  [birth year]   DATE OF ADMISSION:  01/09/2006  DATE OF DISCHARGE:  01/14/2006                               DISCHARGE SUMMARY   ADMITTING DIAGNOSES:  1. Right hip end-stage degenerative joint disease.  2. History of hypertension.  3. Status post knee joint replacements.   DISCHARGE DIAGNOSES:  1. Right hip end-stage degenerative joint disease.  2. History of hypertension.  3. Status post knee joint replacements.  4. Blood loss anemia.  5. Temperature elevation of unknown origin.   BRIEF HISTORY:  [name] is a 55 year old black male who is a  patient well-known to our practice.  We have replaced both of his knees  in the past and he also has significant arthritis in both hips, the  right being more painful than the left.  X-rays reveal end-stage DJD in  his hip and we have discussed treatment options with him, that being  total hip replacement and risks of anesthesia, infection, DVT and  possible death.   PERTINENT LABORATORY AND X-RAY FINDINGS:  EKG:  Normal sinus rhythm.   Chest x-ray showed some mild atelectasis.   WBC 12.2, RBC 2.73, hemoglobin 9.2 and blood was replaced as necessary,  hematocrit 26.4, platelets 233,000.  Sodium 135, potassium 3.6, chloride  99, glucose 116, BUN 26, creatinine 1.9, calcium 8.1.  Blood cultures  done, no growth x5 days.  He received 2 units of packed RBCs during his  hospital stay.   COURSE IN THE HOSPITAL:  Postoperatively, he was given a PCA morphine  pump, standing orders, IV Ancef, a gram q.8 h. x3 doses, Lovenox and  Coumadin DVT prophylaxis protocol per pharmacy and other p.o. medicines  included ferrous sulfate, Percocet and Reglan IV for nausea, ice to his  knee, incentive spirometry, knee-high  TEDs and then out of bed with  physical therapy to be touchdown weightbearing, a knee immobilizer on at  night while in bed as to not flex and rotate his hip for possible  dislocation precautions.  Followup lab studies were done as outlined.  The first day postop, he had a temperature elevation to above 100; the  additional vital signs were stable.  Blood pressure was low at 90/60.  He was feeling well.  Lungs were clear.  Abdomen was soft.  Hematocrit  34.7.  Potassium 3.5.  INR 1.3.  The second day postop, his dressing was  changed.  His wound was noted to be benign, minimal drainage, no sign of  infection or irritation.  There was no evidence of active bleeding.  He  had no shortness of breath.  Abdomen was soft and he was redressed.  He  did have some symptoms of UTI and was treated accordingly.  Physical  therapy was ordered for out of bed touchdown weightbearing.  His  hemoglobin and hematocrit did drop and blood was replaced as necessary  and he continued to improve.  At that point, his catheter was removed  and he was discharged home.   CONDITION ON DISCHARGE:  Improved.   DIET:  Unrestricted.   DISCHARGE MEDICATIONS:  He will remain on:  1. Lisinopril 20/12.5 one a day.  2. Lipitor 20 mg one a day.  3. Percocet one or two every 4-6 hours for pain as needed.  4. Coumadin by pharmacy dose regulation x4 weeks.   DISCHARGE INSTRUCTIONS AND FOLLOWUP:  He would have home care that was  arranged through the hospital for pro times and physical therapy and  because of his URI, there was consideration for consultation by an ENT  physician, once leaving the hospital.  His diet was unrestricted.  He  would walk with crutches, touchdown weightbearing.  Dressing can be  changed on a daily basis and any sign of infection, to call our office  at [phone] and then also that same number for an appointment in 10  days.      Lindwood Qua, P.A.      Lubertha Basque Jerl Santos, M.D.   Electronically Signed    MC/MEDQ  D:  02/18/2006  T:  02/18/2006  Job:  [id number]

## 2010-06-29 NOTE — Op Note (Signed)
NAMEKENDALE, REMBOLD                        ACCOUNT NO.:  [id number]   MEDICAL RECORD NO.:  [id number]                   PATIENT TYPE:  AMB   LOCATION:  DSC                                  FACILITY:  [facility]   PHYSICIAN:  Lubertha Basque. Jerl Santos, M.D.             DATE OF BIRTH:  Mar 05, 1955   DATE OF PROCEDURE:  11/09/2002  DATE OF DISCHARGE:                                 OPERATIVE REPORT   PREOPERATIVE DIAGNOSES:  1. Right knee torn medial meniscus.  2. Right knee degenerative joint disease.   POSTOPERATIVE DIAGNOSES:  1. Right knee torn medial meniscus.  2. Right knee degenerative joint disease.   OPERATION PERFORMED:  1. Right knee partial medial meniscectomy.  2. Right knee chondroplasty and abrasion, all three compartments.   SURGEON:  Lubertha Basque. Jerl Santos, M.D.   ASSISTANT:  Prince Rome, P.A.   ANESTHESIA:  Knee block, MAC.   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  He has persisted with terrible right knee pain  despite conservative measures.  He has pain at rest and pain with activity.  By x-ray and MRI he has some moderate degenerative change along with some  torn cartilage and apparently, a torn ACL.  He is offered an arthroscopy for  a diagnosis and treatment of his injuries.  Informed operative consent was  obtained after discussion of possible complications of reaction to  anesthesia and infection.   DESCRIPTION OF PROCEDURE:  The patient was taken to the operating suite  where knee block and MAC were applied.  The patient was positioned supine  and prepped and draped in the normal sterile fashion.  After administration  of preop IV antibiotics, an arthroscopy of the right knee was performed  through two inferior portals.  The suprapatellar pouch was benign while  patellofemoral joint exhibited grade three and four change.  A thorough  chondroplasty was done back to stable tissues.  In the  medial compartment,  he had grade 3 and 4 change with very loose articular cartilage across most  of the medial femoral condyle.  A thorough chondroplasty was done with an  abrasion as well.  He had a medial meniscus tear addressed with a 10%  partial medial meniscectomy back to stable structures.  The ACL appeared to  be in appropriate position but did not tighten normally with anterior  drawer.  This did not appear to be in a bad position and was not removed.  The PCL appeared to be intact.  In the lateral compartment, he also had  grade 3 and 4 change addressed with a thorough chondroplasty and abrasion.  The knee was thoroughly irrigated at the end of the case followed by  placement of Marcaine with epinephrine and morphine plus Depo-Medrol.  Adaptic was placed over the portals followed by dry gauze and a loose Ace  wrap.  Estimated  blood loss and intraoperative fluids can be obtained from  anesthesia records.   DISPOSITION:  The patient was taken to the recovery room in stable  condition.  Plans were for the patient to go home the same day and to follow  up in the office in less than a week.  I will contact him by phone tonight.                                                   Lubertha Basque Jerl Santos, M.D.    PGD/MEDQ  D:  11/09/2002  T:  11/09/2002  Job:  [id number]

## 2010-06-29 NOTE — Discharge Summary (Signed)
[name], [name]                        ACCOUNT NO.:  [id number]   MEDICAL RECORD NO.:  [id number]                   PATIENT TYPE:  INP   LOCATION:  5034                                 FACILITY:  [facility]   PHYSICIAN:  Lubertha Basque. Jerl Santos, M.D.             DATE OF BIRTH:  [birth year]   DATE OF ADMISSION:  01/18/2003  DATE OF DISCHARGE:  01/21/2003                                 DISCHARGE SUMMARY   ADMISSION DIAGNOSES:  1. Left knee end-stage degenerative joint disease.  2. History of hypertension.   DISCHARGE DIAGNOSES:  1. Left knee end-stage degenerative joint disease.  2. History of hypertension.  3. Urinary tract infection.   BRIEF HISTORY:  Mr. [name] is a 55 year old black male who is a patient  well known to our practice.  He has had significant left knee pain.  He was  originally injured at work and was having pain with every step and nighttime  pain and difficulty walking.  X-rays revealed end-stage DJD in his left  knee.  We discussed treatment options with him and decided to proceed with  total knee replacement for pain relief.   PERTINENT LABORATORY AND X-RAY FINDINGS:  His EKG is a normal sinus rhythm.   Chest x-ray, left lower lung atelectasis.   Laboratory results:  Last hemoglobin 10.8, hematocrit 31.1.  Pro time is  drawn regularly, as he was on low-dose Coumadin protocol.  Last INR was 1.3.  Glucose minimally elevated at 142.  Urinalysis was done, and did show some  viridans Streptococcus greater than 100,000 colonies.  Blood cultures done:  No growth.   HOSPITAL COURSE:  He was admitted after the operation.  He was put on IV  Ancef 1 gm q.8h. x3 doses.  Physical therapy was ordered for weightbearing  as tolerated.  Pharmacy was ordered for low-dose Coumadin and Lovenox  protocol for DVT prophylaxis.  The first day postop he was comfortable.  He  was controlled on a PCA morphine pump.  The second day postop, his drains  were pulled.  Dressings changed.   The wound was benign.  He was started on  iron sulfate 325 1 per day.  He had a desire to go home.  His temperature  did spike in the evenings.  Blood cultures and urine cultures were done, and  results are in the laboratory/x-ray section of this discharge summary.  He  was having no cough, felt fine.  No urinary symptoms, frequency, dysuria, or  malodor.  Was discharged home.  Arrangements were made with the home-care  agency, [facility], for followup and help at home.   CONDITION ON DISCHARGE:  Improved.   FOLLOW UP:  1. He will remain on the following medications:  Percocet 1-2 q.4-6h. p.r.n.     pain.  Pharmacy for dosing of Coumadin, which is 7.5 mg daily.     Lisinopril 20 mg 1 p.o. q.d.  2. [facility] for arrangements for physical therapy and blood draws     for anticoagulation low-dose Coumadin protocol.  First draw is to be done     on December 13.  3. He may be weightbearing as tolerated.  4. Dressing change daily.  5. Return to the office in 7-10 days.  I will call him in regards to his     urinary findings and follow up on the culture reports that are now     available.      Lindwood Qua, P.A.                    Lubertha Basque Jerl Santos, M.D.    MC/MEDQ  D:  01/31/2003  T:  02/01/2003  Job:  [id number]

## 2010-11-20 LAB — CBC
HCT: 27.3 — ABNORMAL LOW
HCT: 35.7 — ABNORMAL LOW
Hemoglobin: 11.9 — ABNORMAL LOW
Hemoglobin: 9.4 — ABNORMAL LOW
MCHC: 33.4
MCHC: 34.3
MCV: 100.3 — ABNORMAL HIGH
MCV: 98.9
Platelets: 162
Platelets: 185
RBC: 2.76 — ABNORMAL LOW
RBC: 3.56 — ABNORMAL LOW
RDW: 13
RDW: 13
WBC: 14.7 — ABNORMAL HIGH
WBC: 15.4 — ABNORMAL HIGH

## 2010-11-20 LAB — PROTIME-INR
INR: 1.5
INR: 1.8 — ABNORMAL HIGH
INR: 1.9 — ABNORMAL HIGH
Prothrombin Time: 18.4 — ABNORMAL HIGH
Prothrombin Time: 21.5 — ABNORMAL HIGH
Prothrombin Time: 22.7 — ABNORMAL HIGH

## 2010-11-20 LAB — BASIC METABOLIC PANEL
BUN: 20
BUN: 33 — ABNORMAL HIGH
CO2: 31
CO2: 31
Calcium: 8.3 — ABNORMAL LOW
Calcium: 8.5
Chloride: 93 — ABNORMAL LOW
Chloride: 93 — ABNORMAL LOW
Creatinine, Ser: 1.54 — ABNORMAL HIGH
Creatinine, Ser: 2.13 — ABNORMAL HIGH
GFR calc Af Amer: 40 — ABNORMAL LOW
GFR calc Af Amer: 58 — ABNORMAL LOW
GFR calc non Af Amer: 33 — ABNORMAL LOW
GFR calc non Af Amer: 48 — ABNORMAL LOW
Glucose, Bld: 119 — ABNORMAL HIGH
Glucose, Bld: 130 — ABNORMAL HIGH
Potassium: 3.4 — ABNORMAL LOW
Potassium: 3.5
Sodium: 132 — ABNORMAL LOW
Sodium: 134 — ABNORMAL LOW

## 2010-11-21 LAB — BASIC METABOLIC PANEL
BUN: 15
BUN: 16
CO2: 32
CO2: 32
Calcium: 8.9
Calcium: 9.7
Chloride: 96
Chloride: 98
Creatinine, Ser: 1.09
Creatinine, Ser: 1.3
GFR calc Af Amer: >60
GFR calc Af Amer: >60
GFR calc non Af Amer: 58 — ABNORMAL LOW
GFR calc non Af Amer: >60
Glucose, Bld: 106 — ABNORMAL HIGH
Glucose, Bld: 150 — ABNORMAL HIGH
Potassium: 3.5
Potassium: 3.7
Sodium: 138
Sodium: 138

## 2010-11-21 LAB — CBC
HCT: 40.9
HCT: 48.3
Hemoglobin: 13.8
Hemoglobin: 16.2
MCHC: 33.5
MCHC: 33.7
MCV: 99.6
MCV: 99.7
Platelets: 204
Platelets: 221
RBC: 4.11 — ABNORMAL LOW
RBC: 4.85
RDW: 13
RDW: 13.3
WBC: 11.5 — ABNORMAL HIGH
WBC: 6.1

## 2010-11-21 LAB — PROTIME-INR
INR: 1.1
Prothrombin Time: 14

## 2011-03-16 ENCOUNTER — Encounter (HOSPITAL_COMMUNITY): Payer: Self-pay | Admitting: Emergency Medicine

## 2011-03-16 ENCOUNTER — Emergency Department (HOSPITAL_COMMUNITY): Payer: BC Managed Care – PPO

## 2011-03-16 ENCOUNTER — Emergency Department (HOSPITAL_COMMUNITY)
Admission: EM | Admit: 2011-03-16 | Discharge: 2011-03-16 | Disposition: A | Discharge status: Home / Self Care | Payer: BC Managed Care – PPO | Attending: Emergency Medicine | Admitting: Emergency Medicine

## 2011-03-16 DIAGNOSIS — Z79899 Other long term (current) drug therapy: Secondary | ICD-10-CM | POA: Insufficient documentation

## 2011-03-16 DIAGNOSIS — N390 Urinary tract infection, site not specified: Secondary | ICD-10-CM

## 2011-03-16 DIAGNOSIS — R61 Generalized hyperhidrosis: Secondary | ICD-10-CM | POA: Insufficient documentation

## 2011-03-16 DIAGNOSIS — I10 Essential (primary) hypertension: Secondary | ICD-10-CM | POA: Insufficient documentation

## 2011-03-16 DIAGNOSIS — J111 Influenza due to unidentified influenza virus with other respiratory manifestations: Secondary | ICD-10-CM

## 2011-03-16 DIAGNOSIS — R000 Tachycardia, unspecified: Secondary | ICD-10-CM | POA: Insufficient documentation

## 2011-03-16 DIAGNOSIS — R509 Fever, unspecified: Secondary | ICD-10-CM | POA: Insufficient documentation

## 2011-03-16 DIAGNOSIS — R112 Nausea with vomiting, unspecified: Secondary | ICD-10-CM | POA: Insufficient documentation

## 2011-03-16 DIAGNOSIS — J3489 Other specified disorders of nose and nasal sinuses: Secondary | ICD-10-CM | POA: Insufficient documentation

## 2011-03-16 HISTORY — DX: Essential (primary) hypertension: I10

## 2011-03-16 LAB — CBC
HCT: 40.3 % (ref 39.0–52.0)
Hemoglobin: 13.9 g/dL (ref 13.0–17.0)
MCH: 34.2 pg — ABNORMAL HIGH (ref 26.0–34.0)
MCHC: 34.5 g/dL (ref 30.0–36.0)
MCV: 99 fL (ref 78.0–100.0)
Platelets: 215 10*3/uL (ref 150–400)
RBC: 4.07 MIL/uL — ABNORMAL LOW (ref 4.22–5.81)
RDW: 12.6 % (ref 11.5–15.5)
WBC: 17.6 10*3/uL — ABNORMAL HIGH (ref 4.0–10.5)

## 2011-03-16 LAB — DIFFERENTIAL
Basophils Absolute: 0 10*3/uL (ref 0.0–0.1)
Basophils Relative: 0 % (ref 0–1)
Eosinophils Absolute: 0.1 10*3/uL (ref 0.0–0.7)
Eosinophils Relative: 1 % (ref 0–5)
Lymphocytes Relative: 2 % — ABNORMAL LOW (ref 12–46)
Lymphs Abs: 0.3 10*3/uL — ABNORMAL LOW (ref 0.7–4.0)
Monocytes Absolute: 0.1 10*3/uL (ref 0.1–1.0)
Monocytes Relative: 1 % — ABNORMAL LOW (ref 3–12)
Neutro Abs: 17 10*3/uL — ABNORMAL HIGH (ref 1.7–7.7)
Neutrophils Relative %: 97 % — ABNORMAL HIGH (ref 43–77)

## 2011-03-16 LAB — URINALYSIS, MICROSCOPIC ONLY
Protein, ur: >300 mg/dL — AB
Urobilinogen, UA: 1 mg/dL (ref 0.0–1.0)

## 2011-03-16 LAB — BASIC METABOLIC PANEL
BUN: 26 mg/dL — ABNORMAL HIGH (ref 6–23)
CO2: 27 mEq/L (ref 19–32)
Calcium: 8.5 mg/dL (ref 8.4–10.5)
Chloride: 102 mEq/L (ref 96–112)
Creatinine, Ser: 1.19 mg/dL (ref 0.50–1.35)
GFR calc Af Amer: 78 mL/min — ABNORMAL LOW (ref >90)
GFR calc non Af Amer: 67 mL/min — ABNORMAL LOW (ref >90)
Glucose, Bld: 111 mg/dL — ABNORMAL HIGH (ref 70–99)
Potassium: 3.3 mEq/L — ABNORMAL LOW (ref 3.5–5.1)
Sodium: 139 mEq/L (ref 135–145)

## 2011-03-16 LAB — LACTIC ACID, PLASMA: Lactic Acid, Venous: 1.3 mmol/L (ref 0.5–2.2)

## 2011-03-16 MED ORDER — ONDANSETRON 4 MG PO TBDP: 4.0000 mg | ORAL_TABLET | Freq: Three times a day (TID) | ORAL | Status: AC | PRN

## 2011-03-16 MED ORDER — SODIUM CHLORIDE 0.9 % IV SOLN
Freq: Once | INTRAVENOUS | Status: AC
  Administered 2011-03-16: 09:00:00 via INTRAVENOUS

## 2011-03-16 MED ORDER — SODIUM CHLORIDE 0.9 % IV BOLUS (SEPSIS)
1000.0000 mL | Freq: Once | INTRAVENOUS | Status: AC
Start: 2011-03-16 — End: 2011-03-16
  Administered 2011-03-16: 1000 mL via INTRAVENOUS

## 2011-03-16 MED ORDER — ACETAMINOPHEN 325 MG PO TABS
ORAL_TABLET | ORAL | Status: AC
  Administered 2011-03-16: 975 mg
  Filled 2011-03-16: qty 3

## 2011-03-16 MED ORDER — LEVOFLOXACIN 750 MG PO TABS: 750.0000 mg | ORAL_TABLET | Freq: Every day | ORAL | Status: AC

## 2011-03-16 MED ORDER — ONDANSETRON HCL 4 MG/2ML IJ SOLN
INTRAMUSCULAR | Status: AC
  Filled 2011-03-16: qty 2

## 2011-03-16 MED ORDER — ACETAMINOPHEN 500 MG PO TABS
1000.0000 mg | ORAL_TABLET | ORAL | Status: DC
  Filled 2011-03-16: qty 2

## 2011-03-16 NOTE — ED Provider Notes (Signed)
History     CSN: [id number]  Arrival date & time 03/16/11  0617   First MD Initiated Contact with Patient 03/16/11 [phone]      Chief Complaint  Patient presents with  . Nausea    (Consider location/radiation/quality/duration/timing/severity/associated sxs/prior treatment) Patient is a 56 y.o. male presenting with fever. The history is provided by the patient.  Fever Primary symptoms of the febrile illness include fever and vomiting. Primary symptoms do not include fatigue, visual change, headaches, cough, wheezing, shortness of breath, abdominal pain, nausea, diarrhea, dysuria, altered mental status, myalgias or rash. The current episode started more than 1 week ago. This is a new problem.   Pt with PMH significant for HTN states he has been sick with a "stuffy nose" x 2 weeks. He denies any associated cough, chest pain, shortness of breath. Denies abd pain, diarrhea. This evening, he awoke from sleep with chills, shaking, and sweating. EMS was called and he vomited x 1 on the way to the truck. He was given Zofran en route and currently denies nausea. He is not a smoker and denies hx of asthma or other pulmonary problems.  Past Medical History  Diagnosis Date  . Hypertension     No past surgical history on file.  No family history on file.  History  Substance Use Topics  . Smoking status: Not on file  . Smokeless tobacco: Not on file  . Alcohol Use:       Review of Systems  Constitutional: Positive for fever and chills. Negative for diaphoresis, activity change, appetite change and fatigue.  HENT: Positive for congestion and rhinorrhea. Negative for sore throat, trouble swallowing, neck pain, neck stiffness and sinus pressure.   Eyes: Negative for photophobia, discharge, redness and visual disturbance.  Respiratory: Negative for cough, chest tightness, shortness of breath and wheezing.   Cardiovascular: Negative for chest pain and palpitations.  Gastrointestinal: Positive  for vomiting. Negative for nausea, abdominal pain and diarrhea.  Genitourinary: Negative for dysuria.  Musculoskeletal: Negative for myalgias.  Skin: Negative for rash.  Neurological: Negative for headaches.  Psychiatric/Behavioral: Negative for altered mental status.    Allergies  Review of patient's allergies indicates no known allergies.  Home Medications   Current Outpatient Rx  Name Route Sig Dispense Refill  . LISINOPRIL-HYDROCHLOROTHIAZIDE 20-25 MG PO TABS Oral Take 1 tablet by mouth daily.    Marland Kitchen SILDENAFIL CITRATE 100 MG PO TABS Oral Take 100 mg by mouth daily as needed. For erectile dysfuction      BP 137/75  Pulse 120  Temp(Src) 103.2 F (39.6 C) (Rectal)  Resp 18  SpO2 95%  Physical Exam  Nursing note and vitals reviewed. Constitutional: He is oriented to person, place, and time. He appears well-developed and well-nourished.  Non-toxic appearance. No distress.       Sweating. Febrile.  HENT:  Head: Normocephalic and atraumatic.  Right Ear: Tympanic membrane and external ear normal.  Left Ear: Tympanic membrane and external ear normal.  Nose: Nose normal. Right sinus exhibits no maxillary sinus tenderness and no frontal sinus tenderness. Left sinus exhibits no maxillary sinus tenderness and no frontal sinus tenderness.  Mouth/Throat: No oropharyngeal exudate.  Eyes: Conjunctivae and EOM are normal.  Neck: Normal range of motion. Neck supple. No Brudzinski's sign noted.  Cardiovascular: Regular rhythm and normal heart sounds.  Tachycardia present.  Exam reveals no gallop and no friction rub.   No murmur heard. Pulmonary/Chest: Effort normal and breath sounds normal. No respiratory distress.  Abdominal: Soft.  Bowel sounds are normal. He exhibits no distension and no mass. There is no tenderness. There is no rebound and no guarding.  Musculoskeletal: Normal range of motion.  Lymphadenopathy:    He has no cervical adenopathy.  Neurological: He is alert and oriented  to person, place, and time. No cranial nerve deficit.  Skin: Skin is warm and dry. No rash noted. He is not diaphoretic.  Psychiatric: He has a normal mood and affect.    ED Course  Procedures (including critical care time)  Labs Reviewed  CBC - Abnormal; Notable for the following:    WBC 17.6 (*)    RBC 4.07 (*)    MCH 34.2 (*)    All other components within normal limits  DIFFERENTIAL - Abnormal; Notable for the following:    Neutrophils Relative 97 (*)    Neutro Abs 17.0 (*)    Lymphocytes Relative 2 (*)    Lymphs Abs 0.3 (*)    Monocytes Relative 1 (*)    All other components within normal limits  BASIC METABOLIC PANEL - Abnormal; Notable for the following:    Potassium 3.3 (*)    Glucose, Bld 111 (*)    BUN 26 (*)    GFR calc non Af Amer 67 (*)    GFR calc Af Amer 78 (*)    All other components within normal limits  URINALYSIS, WITH MICROSCOPIC - Abnormal; Notable for the following:    Color, Urine AMBER (*) BIOCHEMICALS MAY BE AFFECTED BY COLOR   APPearance CLOUDY (*)    Hgb urine dipstick MODERATE (*)    Protein, ur >300 (*)    Leukocytes, UA SMALL (*)    Bacteria, UA MANY (*)    Squamous Epithelial / LPF MANY (*)    All other components within normal limits  LACTIC ACID, PLASMA  URINE CULTURE   Dg Chest 2 View  03/16/2011  *RADIOLOGY REPORT*  Clinical Data: Fever, congestion  CHEST - 2 VIEW  Comparison: 01/10/2006  Findings: Normal heart size and vascularity.  Negative for focal pneumonia, collapse, consolidation, edema, effusion or pneumothorax.  Trachea midline.  IMPRESSION: No acute chest finding.  Original Report Authenticated By: Judie Petit. Ruel Favors, M.D.  I personally reviewed the patient's plain films.   1. Influenza-like illness   2. Urinary tract infection       MDM  6:45 AM Pt assessed with Dr. Karma Ganja. Febrile and tachycardic (which may be attributable to fever). Pt appears stable and non-toxic. BP nl. Plan discussed with Dr. Karma Ganja - rehydrate, give  APAP, obtain labs, urine, and CXR. Will cont to monitor.  8:55 AM Pt's HR and temp have come down with APAP. He continues to appear stable and states that he is feeling better. Labs with elevated WBC which may be nonspecific finding. Lactate normal. Urine with questionable evidence for UTI - many bacteria but contaminated with squamous cells. Plan to start on PO abx and tx as complicated UTI. Urine sent for cx. Have discussed with Dr. Lynelle Doctor as well as the patient. Strict return precautions given.   Grant Fontana, [place] 03/16/11 [phone]

## 2011-03-16 NOTE — ED Provider Notes (Signed)
Medical screening examination/treatment/procedure(s) were conducted as a shared visit with non-physician practitioner(s) and myself.  I personally evaluated the patient during the encounter  Pt seen and examined, pt febrile, tachycardic but otherwise nontoxic appearing.  Labs reveal UA c/w UTI-plan to treat for complicated UTI.  No ttp over sinuses ( pt has had congestion x approx 1 week).    Ethelda Chick, MD 03/16/11 [phone]

## 2011-03-16 NOTE — ED Notes (Signed)
Patient transported to X-ray 

## 2011-03-16 NOTE — ED Notes (Signed)
Informed patient that we need a urine specimen. Provided patient with urinal and privacy. Will check back shortly.

## 2011-03-16 NOTE — ED Notes (Signed)
Pt returned from xray

## 2011-03-16 NOTE — ED Notes (Signed)
Patient states he has been feeling "sick" for an undescribable amount of time family states approx 1 week. Symptoms are vague nasal stuffy and unbalanced. Vomited once per EMS. Given 4mg  IV zofran. #18 LFA

## 2011-03-20 LAB — URINE CULTURE: Culture  Setup Time: 201302021320

## 2011-03-21 NOTE — ED Notes (Signed)
+   Urine. Patient treated with levaquin. Sensitive to same. Per protocol MD.

## 2019-09-30 ENCOUNTER — Encounter (HOSPITAL_BASED_OUTPATIENT_CLINIC_OR_DEPARTMENT_OTHER): Payer: Self-pay | Admitting: *Deleted

## 2019-09-30 ENCOUNTER — Other Ambulatory Visit: Payer: Self-pay

## 2019-09-30 ENCOUNTER — Emergency Department (HOSPITAL_BASED_OUTPATIENT_CLINIC_OR_DEPARTMENT_OTHER)
Admission: EM | Admit: 2019-09-30 | Discharge: 2019-10-01 | Disposition: A | Discharge status: Home / Self Care | Payer: Medicare Other | Attending: Emergency Medicine | Admitting: Emergency Medicine

## 2019-09-30 DIAGNOSIS — R61 Generalized hyperhidrosis: Secondary | ICD-10-CM | POA: Diagnosis not present

## 2019-09-30 DIAGNOSIS — Z20822 Contact with and (suspected) exposure to covid-19: Secondary | ICD-10-CM | POA: Insufficient documentation

## 2019-09-30 DIAGNOSIS — Z79899 Other long term (current) drug therapy: Secondary | ICD-10-CM | POA: Insufficient documentation

## 2019-09-30 DIAGNOSIS — N390 Urinary tract infection, site not specified: Secondary | ICD-10-CM | POA: Insufficient documentation

## 2019-09-30 DIAGNOSIS — I10 Essential (primary) hypertension: Secondary | ICD-10-CM | POA: Diagnosis not present

## 2019-09-30 DIAGNOSIS — R509 Fever, unspecified: Secondary | ICD-10-CM | POA: Diagnosis present

## 2019-09-30 LAB — CBC WITH DIFFERENTIAL/PLATELET
Abs Immature Granulocytes: 0.06 10*3/uL (ref 0.00–0.07)
Basophils Absolute: 0.1 10*3/uL (ref 0.0–0.1)
Basophils Relative: 0 %
Eosinophils Absolute: 0.1 10*3/uL (ref 0.0–0.5)
Eosinophils Relative: 1 %
HCT: 43.6 % (ref 39.0–52.0)
Hemoglobin: 14.5 g/dL (ref 13.0–17.0)
Immature Granulocytes: 0 %
Lymphocytes Relative: 4 %
Lymphs Abs: 0.6 10*3/uL — ABNORMAL LOW (ref 0.7–4.0)
MCH: 33 pg (ref 26.0–34.0)
MCHC: 33.3 g/dL (ref 30.0–36.0)
MCV: 99.1 fL (ref 80.0–100.0)
Monocytes Absolute: 1 10*3/uL (ref 0.1–1.0)
Monocytes Relative: 7 %
Neutro Abs: 13.3 10*3/uL — ABNORMAL HIGH (ref 1.7–7.7)
Neutrophils Relative %: 88 %
Platelets: 198 10*3/uL (ref 150–400)
RBC: 4.4 MIL/uL (ref 4.22–5.81)
RDW: 12 % (ref 11.5–15.5)
WBC: 15 10*3/uL — ABNORMAL HIGH (ref 4.0–10.5)
nRBC: 0 % (ref 0.0–0.2)

## 2019-09-30 LAB — SARS CORONAVIRUS 2 BY RT PCR (HOSPITAL ORDER, PERFORMED IN ~~LOC~~ HOSPITAL LAB): SARS Coronavirus 2: NEGATIVE

## 2019-09-30 LAB — URINALYSIS, ROUTINE W REFLEX MICROSCOPIC
Bilirubin Urine: NEGATIVE
Glucose, UA: NEGATIVE mg/dL
Ketones, ur: NEGATIVE mg/dL
Nitrite: POSITIVE — AB
Protein, ur: NEGATIVE mg/dL
Specific Gravity, Urine: 1.02 (ref 1.005–1.030)
pH: 5.5 (ref 5.0–8.0)

## 2019-09-30 LAB — COMPREHENSIVE METABOLIC PANEL
ALT: 34 U/L (ref 0–44)
AST: 30 U/L (ref 15–41)
Albumin: 4 g/dL (ref 3.5–5.0)
Alkaline Phosphatase: 67 U/L (ref 38–126)
Anion gap: 13 (ref 5–15)
BUN: 25 mg/dL — ABNORMAL HIGH (ref 8–23)
CO2: 26 mmol/L (ref 22–32)
Calcium: 8.6 mg/dL — ABNORMAL LOW (ref 8.9–10.3)
Chloride: 97 mmol/L — ABNORMAL LOW (ref 98–111)
Creatinine, Ser: 1.34 mg/dL — ABNORMAL HIGH (ref 0.61–1.24)
GFR calc Af Amer: >60 mL/min (ref >60)
GFR calc non Af Amer: 56 mL/min — ABNORMAL LOW (ref >60)
Glucose, Bld: 142 mg/dL — ABNORMAL HIGH (ref 70–99)
Potassium: 3.3 mmol/L — ABNORMAL LOW (ref 3.5–5.1)
Sodium: 136 mmol/L (ref 135–145)
Total Bilirubin: 2.2 mg/dL — ABNORMAL HIGH (ref 0.3–1.2)
Total Protein: 8.4 g/dL — ABNORMAL HIGH (ref 6.5–8.1)

## 2019-09-30 LAB — URINALYSIS, MICROSCOPIC (REFLEX)

## 2019-09-30 MED ORDER — ACETAMINOPHEN 325 MG PO TABS
650.0000 mg | ORAL_TABLET | Freq: Once | ORAL | Status: AC | PRN
  Administered 2019-09-30: 650 mg via ORAL
  Filled 2019-09-30: qty 2

## 2019-09-30 MED ORDER — CEFTRIAXONE SODIUM 2 G IJ SOLR
2.0000 g | Freq: Once | INTRAMUSCULAR | Status: AC
  Administered 2019-09-30: 2 g via INTRAVENOUS
  Filled 2019-09-30: qty 20

## 2019-09-30 NOTE — ED Triage Notes (Signed)
Chills since yesterday. No other symptoms. No pain.

## 2019-09-30 NOTE — ED Notes (Signed)
Pt aware urine specimen ordered, denies ability to provide at this time

## 2019-09-30 NOTE — ED Notes (Addendum)
Pt endorses having chills that resolve then sweating. Pt denies cough, denies fever, denies dysuria

## 2019-10-01 ENCOUNTER — Emergency Department (HOSPITAL_BASED_OUTPATIENT_CLINIC_OR_DEPARTMENT_OTHER): Payer: Medicare Other

## 2019-10-01 DIAGNOSIS — N390 Urinary tract infection, site not specified: Secondary | ICD-10-CM | POA: Diagnosis not present

## 2019-10-01 LAB — LACTIC ACID, PLASMA: Lactic Acid, Venous: 0.9 mmol/L (ref 0.5–1.9)

## 2019-10-01 MED ORDER — TAMSULOSIN HCL 0.4 MG PO CAPS: 0.4000 mg | ORAL_CAPSULE | Freq: Every day | ORAL | 0 refills | Status: AC

## 2019-10-01 MED ORDER — CEFUROXIME AXETIL 250 MG PO TABS: 250.0000 mg | ORAL_TABLET | Freq: Two times a day (BID) | ORAL | 0 refills | Status: AC

## 2019-10-01 NOTE — Discharge Instructions (Signed)
It appears that your fever is because you have an infection in your urinary tract.  You should follow-up with urology to have your prostate checked.  If your prostate is enlarged this could cause infections and may need to be treated.  If you get worsening fevers, nausea, vomiting, back pain or any other worsening symptoms return to the ER immediately.

## 2019-10-01 NOTE — ED Provider Notes (Signed)
[facility] EMERGENCY DEPARTMENT Provider Note   CSN: [id number] Arrival date & time: 09/30/19  2020     History Chief Complaint  Patient presents with  . Chills    [name] is a 64 y.o. male.  Patient presents to the emergency department with complaints of chills and sweats.  This has been ongoing since yesterday.  Patient reports that he will suddenly become cold and shiver.  He covers himself with blankets and then notices that he starts sweating profusely.  He has not taken his temperature.  He has not had any infectious symptoms, denies nasal congestion, sore throat, cough, shortness of breath, abdominal pain, nausea, vomiting, diarrhea.  He has not noticed any urinary symptoms.  Patient denies rash.  He reports that he has not been outside, no tick bites.        Past Medical History:  Diagnosis Date  . Hypertension     There are no problems to display for this patient.   Past Surgical History:  Procedure Laterality Date  . ACHILLES TENDON REPAIR    . HIP SURGERY    . KNEE SURGERY         No family history on file.  Social History   Tobacco Use  . Smoking status: Never Smoker  . Smokeless tobacco: Never Used  Substance Use Topics  . Alcohol use: Never  . Drug use: Never    Home Medications Prior to Admission medications   Medication Sig Start Date End Date Taking? Authorizing Provider  lisinopril-hydrochlorothiazide (PRINZIDE,ZESTORETIC) 20-25 MG per tablet Take 1 tablet by mouth daily.   Yes [provider]  sildenafil (VIAGRA) 100 MG tablet Take 100 mg by mouth daily as needed. For erectile dysfuction   Yes [provider]  cefUROXime (CEFTIN) 250 MG tablet Take 1 tablet (250 mg total) by mouth 2 (two) times daily with a meal. 10/01/19   Daly Whipkey, Canary Brim, MD  tamsulosin (FLOMAX) 0.4 MG CAPS capsule Take 1 capsule (0.4 mg total) by mouth daily. 10/01/19   Gilda Crease, MD    Allergies    Patient  has no known allergies.  Review of Systems   Review of Systems  Constitutional: Positive for chills, diaphoresis and fever.  All other systems reviewed and are negative.   Physical Exam Updated Vital Signs BP (!) 147/75 (BP Location: Left Arm)   Pulse (!) 109   Temp 100.2 F (37.9 C) (Oral)   Resp 18   Ht 6\' 1"  (1.854 m)   Wt 133.8 kg   SpO2 93%   BMI 38.92 kg/m   Physical Exam Vitals and nursing note reviewed.  Constitutional:      General: He is not in acute distress.    Appearance: Normal appearance. He is well-developed.  HENT:     Head: Normocephalic and atraumatic.     Right Ear: Hearing normal.     Left Ear: Hearing normal.     Nose: Nose normal.  Eyes:     Conjunctiva/sclera: Conjunctivae normal.     Pupils: Pupils are equal, round, and reactive to light.  Cardiovascular:     Rate and Rhythm: Regular rhythm.     Heart sounds: S1 normal and S2 normal. No murmur heard.  No friction rub. No gallop.   Pulmonary:     Effort: Pulmonary effort is normal. No respiratory distress.     Breath sounds: Normal breath sounds.  Chest:     Chest wall: No tenderness.  Abdominal:  General: Bowel sounds are normal.     Palpations: Abdomen is soft.     Tenderness: There is no abdominal tenderness. There is no guarding or rebound. Negative signs include Murphy's sign and McBurney's sign.     Hernia: No hernia is present.  Musculoskeletal:        General: Normal range of motion.     Cervical back: Normal range of motion and neck supple.  Skin:    General: Skin is warm and dry.     Findings: No rash.  Neurological:     Mental Status: He is alert and oriented to person, place, and time.     GCS: GCS eye subscore is 4. GCS verbal subscore is 5. GCS motor subscore is 6.     Cranial Nerves: No cranial nerve deficit.     Sensory: No sensory deficit.     Coordination: Coordination normal.  Psychiatric:        Speech: Speech normal.        Behavior: Behavior normal.         Thought Content: Thought content normal.     ED Results / Procedures / Treatments   Labs (all labs ordered are listed, but only abnormal results are displayed) Labs Reviewed  CBC WITH DIFFERENTIAL/PLATELET - Abnormal; Notable for the following components:      Result Value   WBC 15.0 (*)    Neutro Abs 13.3 (*)    Lymphs Abs 0.6 (*)    All other components within normal limits  COMPREHENSIVE METABOLIC PANEL - Abnormal; Notable for the following components:   Potassium 3.3 (*)    Chloride 97 (*)    Glucose, Bld 142 (*)    BUN 25 (*)    Creatinine, Ser 1.34 (*)    Calcium 8.6 (*)    Total Protein 8.4 (*)    Total Bilirubin 2.2 (*)    GFR calc non Af Amer 56 (*)    All other components within normal limits  URINALYSIS, ROUTINE W REFLEX MICROSCOPIC - Abnormal; Notable for the following components:   Color, Urine ORANGE (*)    Hgb urine dipstick TRACE (*)    Nitrite POSITIVE (*)    Leukocytes,Ua SMALL (*)    All other components within normal limits  URINALYSIS, MICROSCOPIC (REFLEX) - Abnormal; Notable for the following components:   Bacteria, UA MANY (*)    All other components within normal limits  SARS CORONAVIRUS 2 BY RT PCR (HOSPITAL ORDER, PERFORMED IN [facility])  CULTURE, BLOOD (ROUTINE X 2)  CULTURE, BLOOD (ROUTINE X 2)  URINE CULTURE  LACTIC ACID, PLASMA    EKG None  Radiology DG Chest Port 1 View  Result Date: 10/01/2019 CLINICAL DATA:  Fever EXAM: PORTABLE CHEST 1 VIEW COMPARISON:  None. FINDINGS: The heart size and mediastinal contours are within normal limits. Both lungs are clear. The visualized skeletal structures are unremarkable. IMPRESSION: No active disease. Electronically Signed   By: Deatra Robinson M.D.   On: 10/01/2019 00:52    Procedures Procedures (including critical care time)  Medications Ordered in ED Medications  acetaminophen (TYLENOL) tablet 650 mg (650 mg Oral Given 09/30/19 2301)  cefTRIAXone (ROCEPHIN) 2 g in sodium  chloride 0.9 % 100 mL IVPB (0 g Intravenous Stopped 10/01/19 0034)    ED Course  I have reviewed the triage vital signs and the nursing notes.  Pertinent labs & imaging results that were available during my care of the patient were reviewed by me  and considered in my medical decision making (see chart for details).    MDM Rules/Calculators/A&P                          Patient presents to the emergency department for evaluation of fever.  Patient reports that he has been experiencing chills and sweats on and off since yesterday.  He was noted to have a fever of 102.6 here in the ER today.  Source of his fever appears to be urinary.  Chest x-ray is clear.  Patient does have an elevated white blood cell count but remainder of blood work was unremarkable.  He appears well.  Lactic acid is not elevated.  Patient did have mild tachycardia at arrival but this has resolved with some fluids and defervesced since.  Patient will be appropriate for discharge and continued antibiotic therapy.  We will have him follow-up with urology to check his prostate.  Patient given return precautions.  Final Clinical Impression(s) / ED Diagnoses Final diagnoses:  Urinary tract infection without hematuria, site unspecified    Rx / DC Orders ED Discharge Orders         Ordered    tamsulosin (FLOMAX) 0.4 MG CAPS capsule  Daily        10/01/19 0108    cefUROXime (CEFTIN) 250 MG tablet  2 times daily with meals        10/01/19 0108           Gilda Crease, MD 10/01/19 [phone]

## 2019-10-03 LAB — URINE CULTURE: Culture: >=100000 — AB

## 2019-10-04 ENCOUNTER — Telehealth: Payer: Self-pay | Admitting: *Deleted

## 2019-10-04 NOTE — Telephone Encounter (Signed)
Post ED Visit - Positive Culture Follow-up  Culture report reviewed by antimicrobial stewardship pharmacist: [facility] []  , Pharm.D. []  Enzo Bi, Pharm.D., BCPS AQ-ID []  , Pharm.D., BCPS []  Celedonio Miyamoto, Pharm.D., BCPS []  [place], Garvin Fila.D., BCPS, AAHIVP []  , Pharm.D., BCPS, AAHIVP []  Georgina Pillion, PharmD, BCPS []  , PharmD, BCPS []  [place], PharmD, BCPS []  [address], PharmD []  , PharmD, BCPS []  Estella Husk, PharmD  Pharmacy Team []  Lysle Pearl, PharmD []  , PharmD []  Phillips Climes, PharmD []  , Rph []  Agapito Games) , PharmD []  Verlan Friends, PharmD []  , PharmD []  Mervyn Gay, PharmD []  , PharmD []  Vinnie Level, PharmD []  [facility], PharmD []  , PharmD []  Len Childs, PharmD   Positive urine culture Treated with Cefuroximie Axetil, organism sensitive to the same and no further patient follow-up is required at this time.  [facility] 10/04/2019, 9:01 AM

## 2019-10-06 LAB — CULTURE, BLOOD (ROUTINE X 2)
Culture: NO GROWTH
Culture: NO GROWTH
Special Requests: ADEQUATE
Special Requests: ADEQUATE

## 2022-02-02 IMAGING — DX DG CHEST 1V PORT
1 series · 1 of 1 positions shown · non-contrast
Comparison: None.

CLINICAL DATA: Fever

EXAM:
PORTABLE CHEST 1 VIEW

[chest ap]
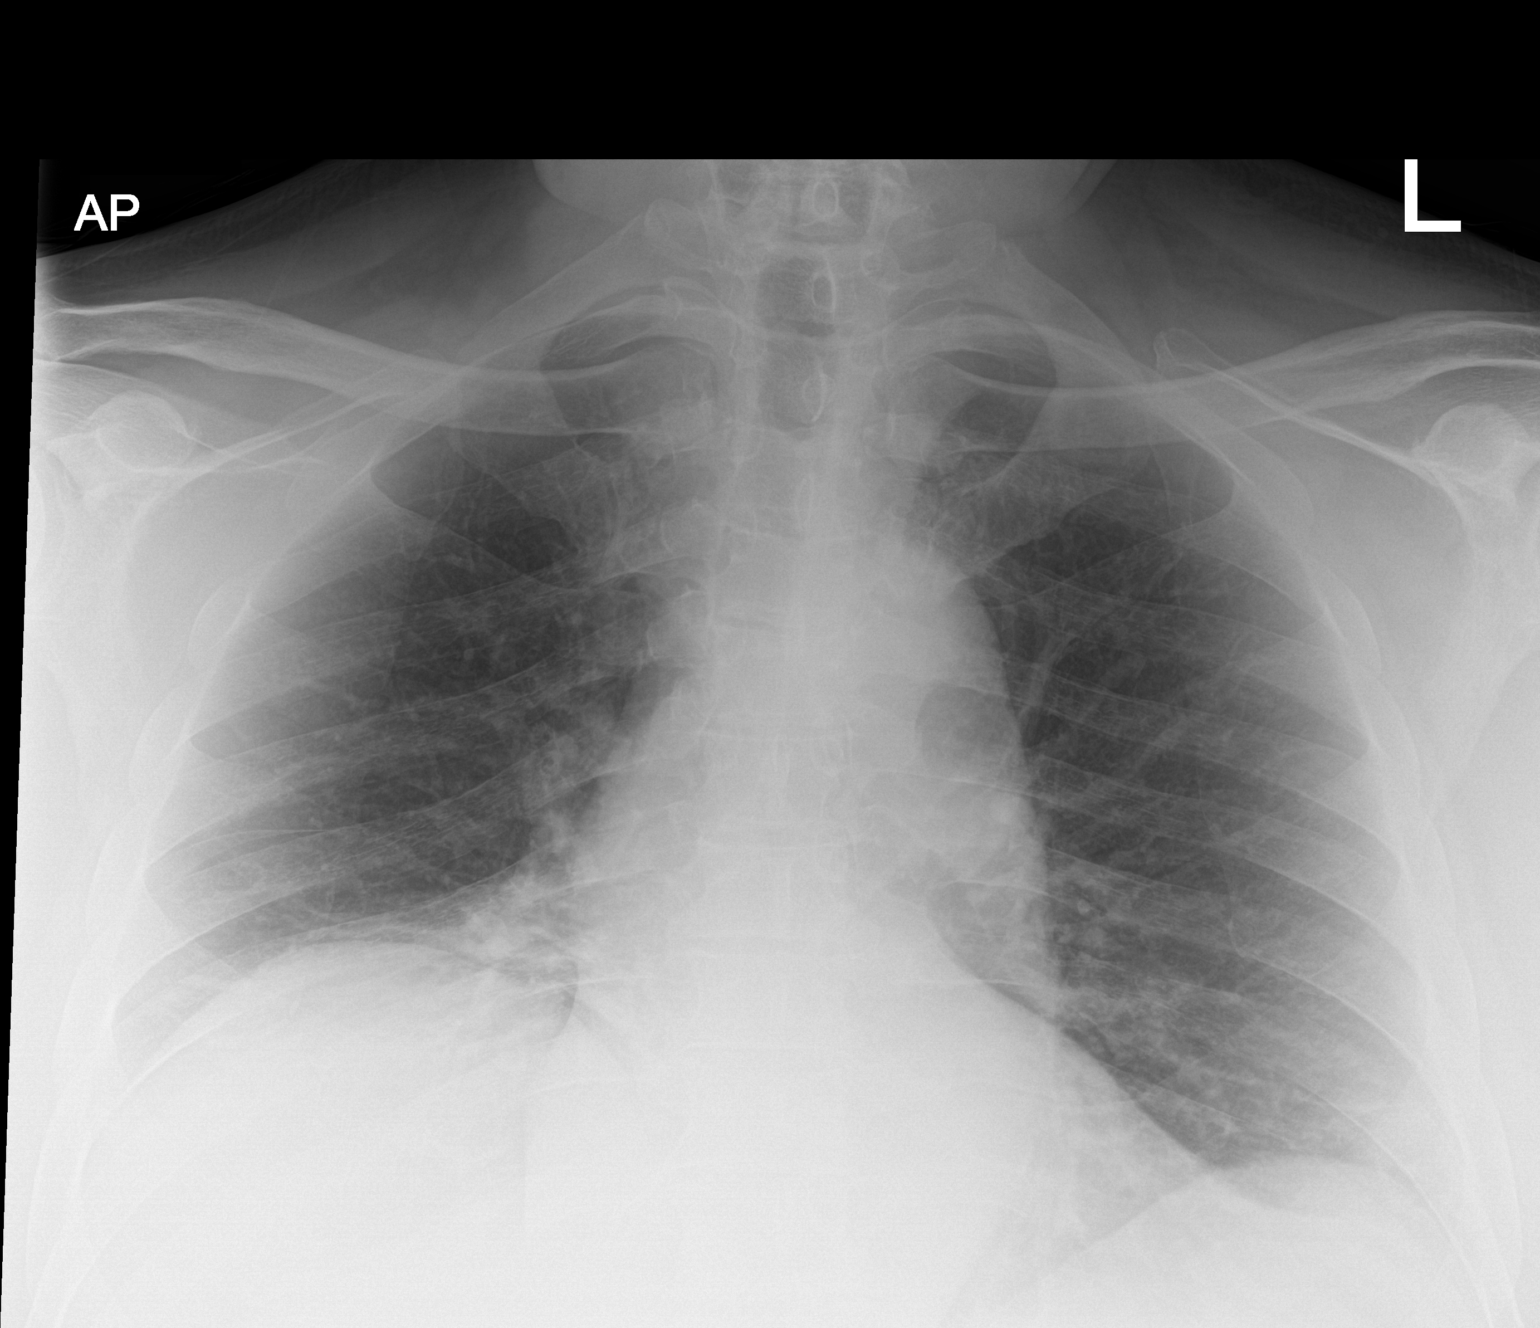

[1 of 1 positions shown; findings below may reference images not displayed]

FINDINGS: The heart size and mediastinal contours are within normal limits.
Both lungs are clear. The visualized skeletal structures are
unremarkable.
IMPRESSION: No active disease.
